# Patient Record
Sex: Female | Born: 1960 | Race: White | Hispanic: No | Marital: Married
Health system: Southern US, Community
[De-identification: ages and names within clinical notes are randomized; demographics above are authoritative.]

---

## 2004-08-24 ENCOUNTER — Ambulatory Visit: Payer: Self-pay | Admitting: General Practice

## 2004-12-30 ENCOUNTER — Ambulatory Visit: Payer: Self-pay | Admitting: Specialist

## 2005-01-25 ENCOUNTER — Inpatient Hospital Stay: Payer: Self-pay | Admitting: Internal Medicine

## 2005-08-23 ENCOUNTER — Ambulatory Visit: Payer: Self-pay | Admitting: General Practice

## 2005-08-26 ENCOUNTER — Ambulatory Visit: Payer: Self-pay | Admitting: Obstetrics and Gynecology

## 2006-08-29 ENCOUNTER — Ambulatory Visit: Payer: Self-pay | Admitting: General Practice

## 2006-08-31 ENCOUNTER — Ambulatory Visit: Payer: Self-pay | Admitting: General Practice

## 2007-03-01 ENCOUNTER — Ambulatory Visit: Payer: Self-pay | Admitting: Internal Medicine

## 2007-04-26 ENCOUNTER — Ambulatory Visit: Payer: Self-pay | Admitting: Obstetrics and Gynecology

## 2007-05-21 ENCOUNTER — Ambulatory Visit: Payer: Self-pay | Admitting: Obstetrics and Gynecology

## 2008-09-17 ENCOUNTER — Ambulatory Visit: Payer: Self-pay | Admitting: General Practice

## 2009-08-01 HISTORY — PX: BREAST BIOPSY: SHX20

## 2009-08-18 ENCOUNTER — Ambulatory Visit: Payer: Self-pay | Admitting: Internal Medicine

## 2009-09-01 ENCOUNTER — Ambulatory Visit: Payer: Self-pay | Admitting: Surgery

## 2009-09-22 ENCOUNTER — Ambulatory Visit: Payer: Self-pay | Admitting: Internal Medicine

## 2010-09-28 ENCOUNTER — Ambulatory Visit: Payer: Self-pay | Admitting: Internal Medicine

## 2011-08-06 IMAGING — US US BREAST BX W LOC DEV 1ST LESION IMG BX SPEC US GUIDE
1 series · 15 of 15 positions shown · non-contrast
Comparison: None

REASON FOR EXAM: left breast mass
COMMENTS:

PROCEDURE:     US  - US GUIDED BIOPSY BREAST LEFT  - September 01, 2009  [DATE]
RESULT:     Indication: Left breast cyst

[Series 1: us breast bx w loc dev 1st lesion img bx spec us g · 15 of 15 slices shown]
[im 1/15]
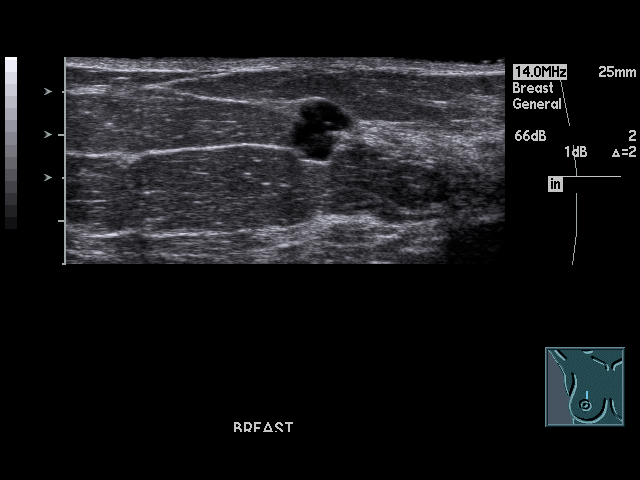
[im 2/15]
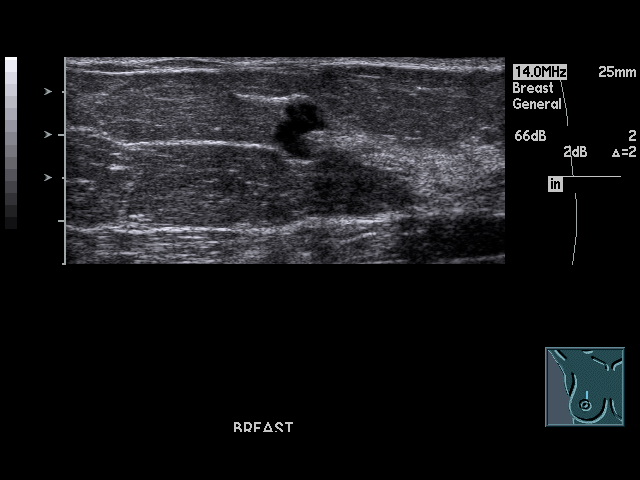
[im 3/15]
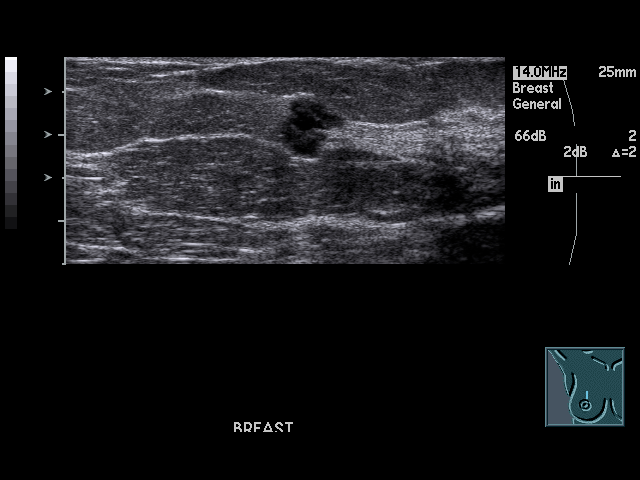
[im 4/15]
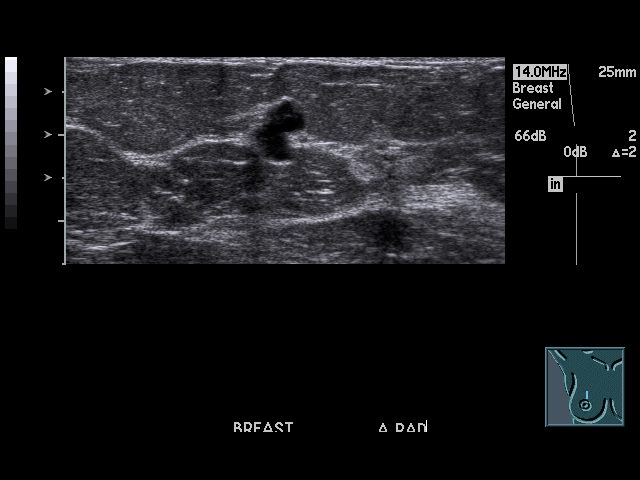
[im 5/15]
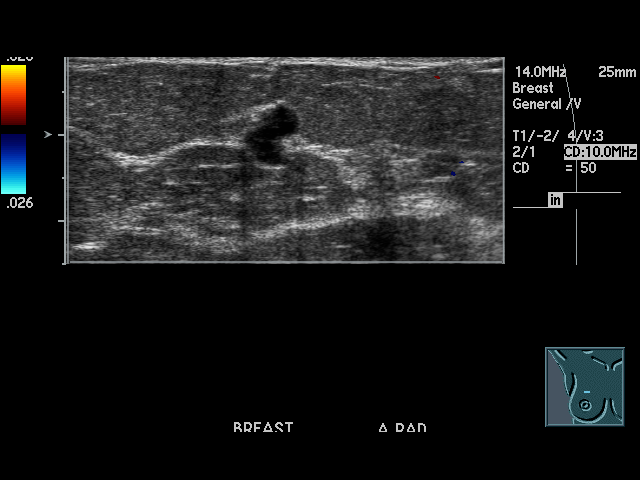
[im 6/15]
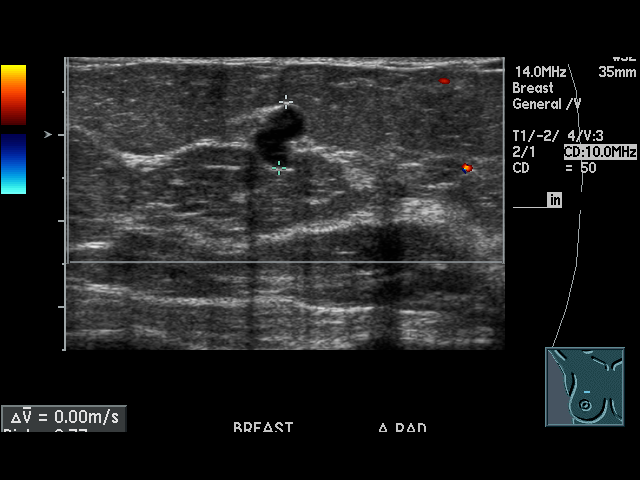
[im 7/15]
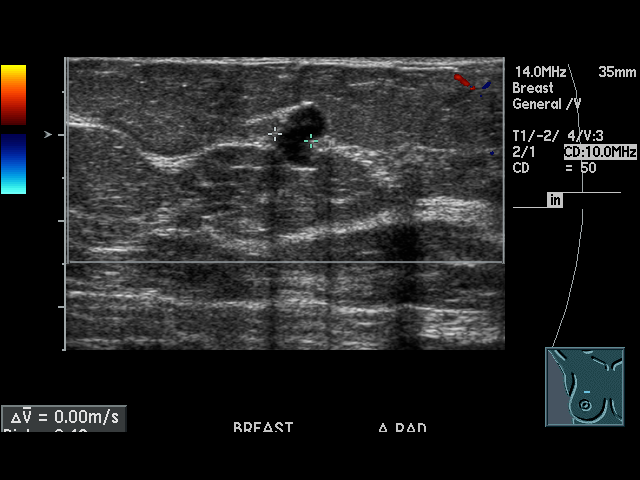
[im 8/15]
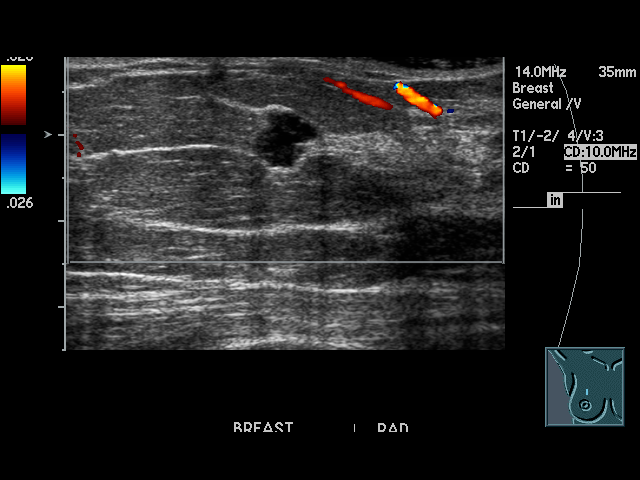
[im 9/15]
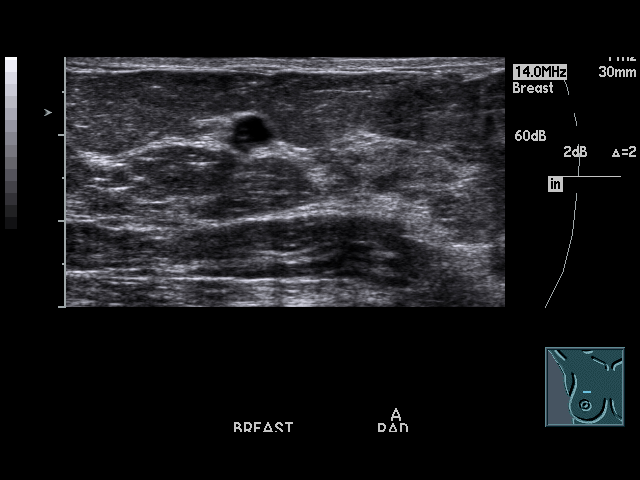
[im 10/15]
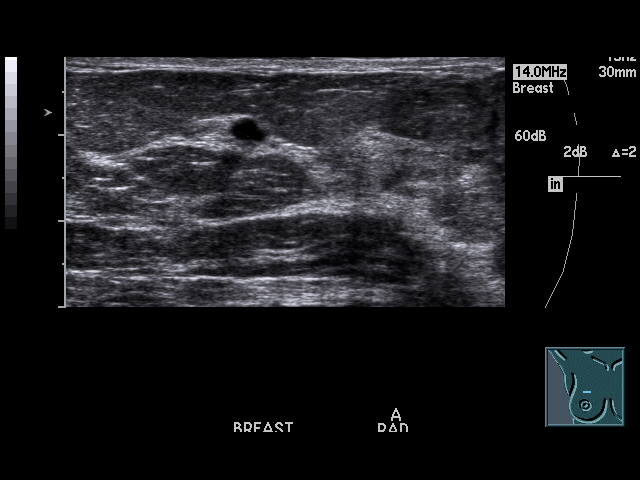
[im 11/15]
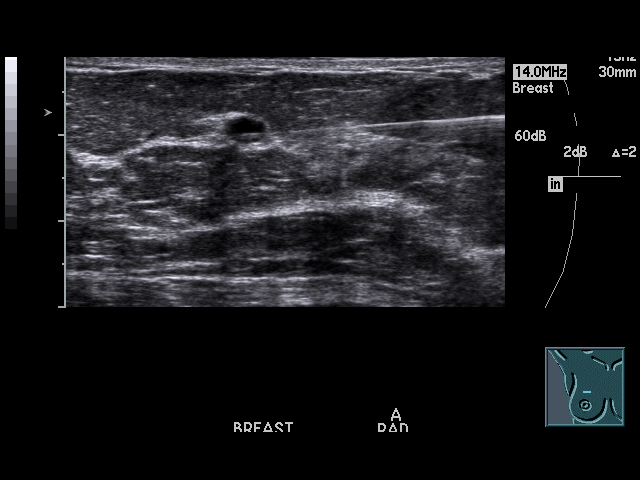
[im 12/15]
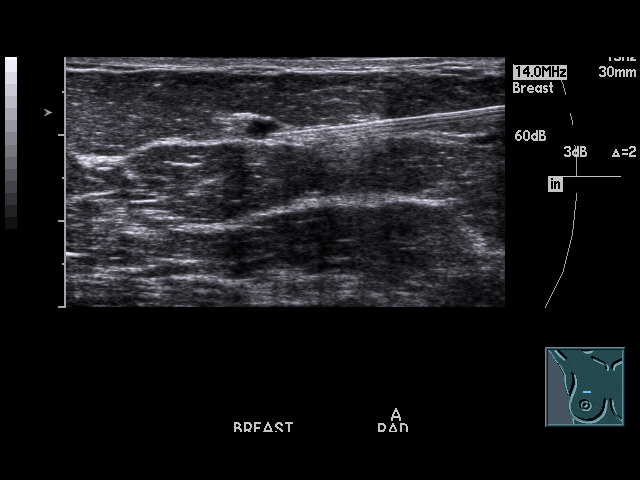
[im 13/15]
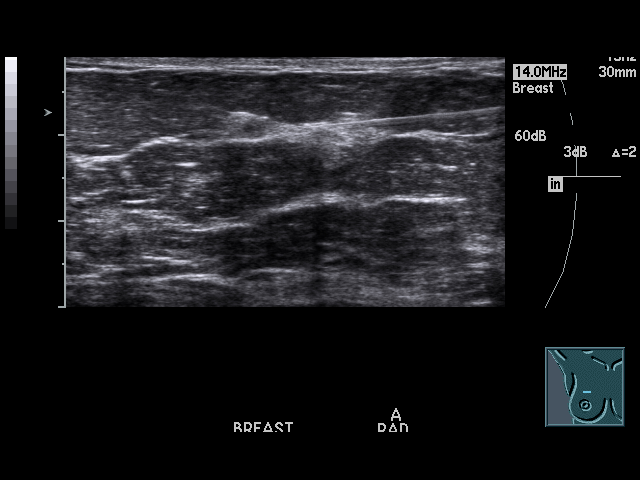
[im 14/15]
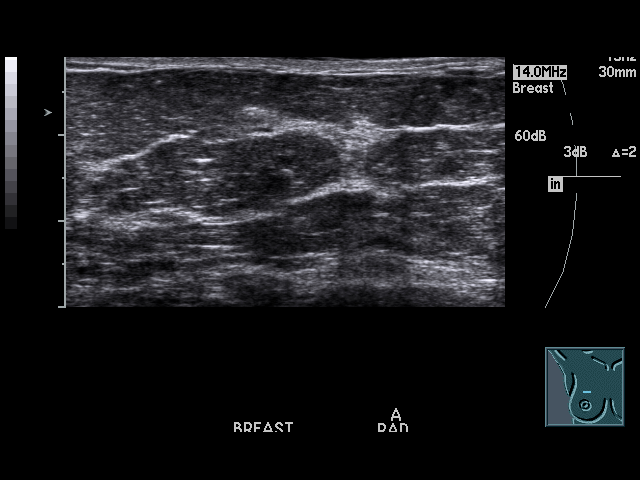
[im 15/15]
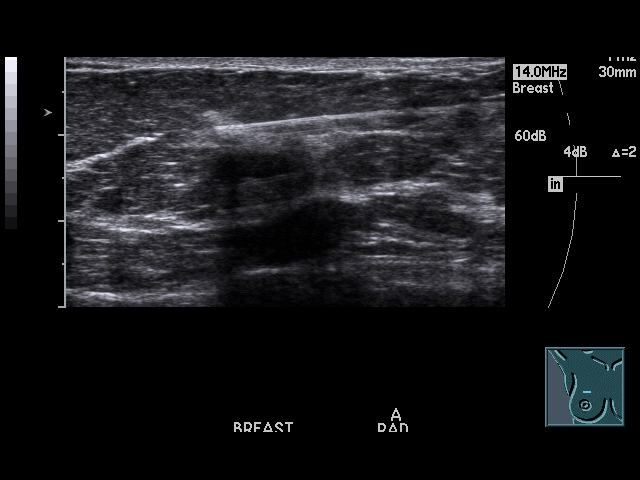

[15 of 15 positions shown; findings below may reference images not displayed]

Consent: After the procedure and its risks including infection, bleeding and
allergy were discussed with the patient and all questions answered, informed
consent was signed.

Procedure: The patient was placed supine on the ultrasound with the left arm
in ABER position. A "timeout" was performed to verify the correct patient
and correct procedure. Initial ultrasound of the left breast demonstrated a
macrolobulated anechoic left breast mass at approximately the 12-1:00
position. The patient was then prepped and draped in the usual sterile
fashion. Realtime ultrasound was used to localize the mass and a biopsy path
was selected. The skin was marked and 1% lidocaine was infiltrated into skin
and soft tissues along the biopsy tract up to the mass for local anesthesia.

A small skin incision with a #11 blade was made at the skin entry site. A
22-gauge needle was advanced into the cystic mass and aspirated. The fluid
lows yellow-greenish in color and was placed into thin prep. Subsequently, a
Biopsy clip was placed at the site of aspiration. The needle was removed,
hemostasis achieved and a band-aid applied to the skin incision. The patient
tolerated the procedure well without immediate complication and was
discharged in good condition after 15 min observation.
IMPRESSION: Successful left breast cyst aspiration.

## 2011-10-05 ENCOUNTER — Ambulatory Visit: Payer: Self-pay | Admitting: Internal Medicine

## 2011-10-07 ENCOUNTER — Ambulatory Visit: Payer: Self-pay | Admitting: Internal Medicine

## 2012-05-31 ENCOUNTER — Ambulatory Visit: Payer: Self-pay | Admitting: Orthopedic Surgery

## 2012-06-07 ENCOUNTER — Ambulatory Visit: Payer: Self-pay | Admitting: Orthopedic Surgery

## 2012-10-10 ENCOUNTER — Ambulatory Visit: Payer: Self-pay | Admitting: Internal Medicine

## 2013-10-17 ENCOUNTER — Ambulatory Visit: Payer: Self-pay | Admitting: General Practice

## 2014-05-12 DIAGNOSIS — N3010 Interstitial cystitis (chronic) without hematuria: Secondary | ICD-10-CM | POA: Insufficient documentation

## 2014-05-12 DIAGNOSIS — E039 Hypothyroidism, unspecified: Secondary | ICD-10-CM | POA: Insufficient documentation

## 2014-10-21 ENCOUNTER — Ambulatory Visit: Payer: Self-pay | Admitting: Internal Medicine

## 2014-11-18 NOTE — Op Note (Signed)
PATIENT NAME:  Mary Kerr, [name] MR#:  [id number] DATE OF BIRTH:  19-Oct-1960  DATE OF PROCEDURE:  06/07/2012  PREOPERATIVE DIAGNOSIS: Right carpal tunnel syndrome.   POSTOPERATIVE DIAGNOSIS: Right carpal tunnel syndrome.   PROCEDURE: Right carpal tunnel release.   SURGEON: Leitha SchullerMichael J. Aradhana Gin, M.D.   ANESTHESIA: General.   DESCRIPTION OF PROCEDURE: The patient was brought to the operating room and after adequate anesthesia was obtained, the arm was prepped and draped in the usual sterile fashion with a tourniquet applied to the upper arm. After appropriate patient identification and time-out procedures were completed, the tourniquet was raised to 250 mmHg. The incision was made in line with the ring metacarpal along a palmar crease, approximately 2 cm in length. The incision was carried down through the skin and subcutaneous tissue with hemostasis being achieved with electrocautery. After spreading the soft tissues, the transverse carpal ligament was identified and released with a small incision initially. A Freer elevator was placed deep to protect the underlying structures and release was then carried out first distally then proximally. After release, there appeared to be compression in the midportion with vascular blush occurring after the more proximal release. This appeared to indicate adequate release. There was moderate tenosynovitis present within the carpal tunnel and a portion debrided.  The wound was then irrigated. The wound was then closed with simple interrupted 5-0 nylon. 10 mL of 0.5% Sensorcaine without epinephrine was infiltrated adjacent to the incision to minimize postoperative pain. There were no complications.  No specimen. The patient was sent to the recovery room in stable condition after application of a sterile dressing of Xeroform, 4 x 4's, Webril, and Ace wrap.   ____________________________ Leitha SchullerMichael J. Calloway Andrus, MD [email or web address] D: 06/07/2012 22:10:09 ET T: 06/08/2012 11:04:28  ET JOB#: [id number] Nolon BussingMICHAEL J Xiana Carns MD ELECTRONICALLY SIGNED 06/08/2012 12:32

## 2015-09-30 ENCOUNTER — Other Ambulatory Visit: Payer: Self-pay | Admitting: Internal Medicine

## 2015-09-30 DIAGNOSIS — Z1231 Encounter for screening mammogram for malignant neoplasm of breast: Secondary | ICD-10-CM

## 2015-10-28 ENCOUNTER — Ambulatory Visit
Admission: RE | Admit: 2015-10-28 | Discharge: 2015-10-28 | Disposition: A | Discharge status: Home / Self Care | Payer: BLUE CROSS/BLUE SHIELD | Source: Ambulatory Visit | Attending: Internal Medicine | Admitting: Internal Medicine

## 2015-10-28 DIAGNOSIS — Z1231 Encounter for screening mammogram for malignant neoplasm of breast: Secondary | ICD-10-CM

## 2016-05-16 DIAGNOSIS — M7711 Lateral epicondylitis, right elbow: Secondary | ICD-10-CM | POA: Insufficient documentation

## 2016-09-28 ENCOUNTER — Other Ambulatory Visit: Payer: Self-pay | Admitting: Internal Medicine

## 2016-09-28 DIAGNOSIS — Z1231 Encounter for screening mammogram for malignant neoplasm of breast: Secondary | ICD-10-CM

## 2016-11-01 ENCOUNTER — Ambulatory Visit
Admission: RE | Admit: 2016-11-01 | Discharge: 2016-11-01 | Disposition: A | Discharge status: Home / Self Care | Payer: BLUE CROSS/BLUE SHIELD | Source: Ambulatory Visit | Attending: Internal Medicine | Admitting: Internal Medicine

## 2016-11-01 DIAGNOSIS — Z1231 Encounter for screening mammogram for malignant neoplasm of breast: Secondary | ICD-10-CM | POA: Insufficient documentation

## 2017-10-16 ENCOUNTER — Other Ambulatory Visit: Payer: Self-pay | Admitting: Internal Medicine

## 2017-10-16 DIAGNOSIS — Z1231 Encounter for screening mammogram for malignant neoplasm of breast: Secondary | ICD-10-CM

## 2017-11-08 ENCOUNTER — Ambulatory Visit
Admission: RE | Admit: 2017-11-08 | Discharge: 2017-11-08 | Disposition: A | Discharge status: Home / Self Care | Payer: BLUE CROSS/BLUE SHIELD | Source: Ambulatory Visit | Attending: Internal Medicine | Admitting: Internal Medicine

## 2017-11-08 DIAGNOSIS — Z1231 Encounter for screening mammogram for malignant neoplasm of breast: Secondary | ICD-10-CM | POA: Diagnosis present

## 2018-05-29 DIAGNOSIS — Z803 Family history of malignant neoplasm of breast: Secondary | ICD-10-CM | POA: Insufficient documentation

## 2018-10-08 ENCOUNTER — Other Ambulatory Visit: Payer: Self-pay | Admitting: Internal Medicine

## 2018-10-08 DIAGNOSIS — Z1231 Encounter for screening mammogram for malignant neoplasm of breast: Secondary | ICD-10-CM

## 2019-01-23 ENCOUNTER — Ambulatory Visit
Admission: RE | Admit: 2019-01-23 | Discharge: 2019-01-23 | Disposition: A | Discharge status: Home / Self Care | Payer: BC Managed Care – PPO | Source: Ambulatory Visit | Attending: Internal Medicine | Admitting: Internal Medicine

## 2019-01-23 ENCOUNTER — Other Ambulatory Visit: Payer: Self-pay

## 2019-01-23 DIAGNOSIS — Z1231 Encounter for screening mammogram for malignant neoplasm of breast: Secondary | ICD-10-CM | POA: Insufficient documentation

## 2019-04-26 ENCOUNTER — Other Ambulatory Visit: Payer: Self-pay

## 2019-04-26 ENCOUNTER — Ambulatory Visit: Payer: Self-pay

## 2019-04-26 DIAGNOSIS — Z9229 Personal history of other drug therapy: Secondary | ICD-10-CM

## 2019-05-23 DIAGNOSIS — R739 Hyperglycemia, unspecified: Secondary | ICD-10-CM | POA: Diagnosis not present

## 2019-05-23 DIAGNOSIS — E039 Hypothyroidism, unspecified: Secondary | ICD-10-CM | POA: Diagnosis not present

## 2019-05-30 DIAGNOSIS — Z0000 Encounter for general adult medical examination without abnormal findings: Secondary | ICD-10-CM | POA: Diagnosis not present

## 2019-05-30 DIAGNOSIS — N3010 Interstitial cystitis (chronic) without hematuria: Secondary | ICD-10-CM | POA: Diagnosis not present

## 2019-11-29 DIAGNOSIS — E039 Hypothyroidism, unspecified: Secondary | ICD-10-CM | POA: Diagnosis not present

## 2019-11-29 DIAGNOSIS — Z0000 Encounter for general adult medical examination without abnormal findings: Secondary | ICD-10-CM | POA: Diagnosis not present

## 2019-11-29 DIAGNOSIS — N200 Calculus of kidney: Secondary | ICD-10-CM | POA: Diagnosis not present

## 2020-01-13 ENCOUNTER — Other Ambulatory Visit: Payer: Self-pay | Admitting: Internal Medicine

## 2020-01-13 DIAGNOSIS — Z1231 Encounter for screening mammogram for malignant neoplasm of breast: Secondary | ICD-10-CM

## 2020-01-28 ENCOUNTER — Ambulatory Visit
Admission: RE | Admit: 2020-01-28 | Discharge: 2020-01-28 | Disposition: A | Discharge status: Home / Self Care | Payer: 59 | Source: Ambulatory Visit | Attending: Internal Medicine | Admitting: Internal Medicine

## 2020-01-28 DIAGNOSIS — Z1231 Encounter for screening mammogram for malignant neoplasm of breast: Secondary | ICD-10-CM | POA: Diagnosis not present

## 2020-05-29 DIAGNOSIS — Z0000 Encounter for general adult medical examination without abnormal findings: Secondary | ICD-10-CM | POA: Diagnosis not present

## 2020-05-29 DIAGNOSIS — N200 Calculus of kidney: Secondary | ICD-10-CM | POA: Diagnosis not present

## 2020-05-29 DIAGNOSIS — E039 Hypothyroidism, unspecified: Secondary | ICD-10-CM | POA: Diagnosis not present

## 2020-06-05 ENCOUNTER — Ambulatory Visit: Payer: 59

## 2020-06-05 DIAGNOSIS — Z23 Encounter for immunization: Secondary | ICD-10-CM

## 2020-06-12 DIAGNOSIS — E876 Hypokalemia: Secondary | ICD-10-CM | POA: Diagnosis not present

## 2020-07-21 DIAGNOSIS — Z0000 Encounter for general adult medical examination without abnormal findings: Secondary | ICD-10-CM | POA: Diagnosis not present

## 2020-07-21 DIAGNOSIS — N3010 Interstitial cystitis (chronic) without hematuria: Secondary | ICD-10-CM | POA: Diagnosis not present

## 2020-12-31 ENCOUNTER — Other Ambulatory Visit: Payer: Self-pay | Admitting: Internal Medicine

## 2020-12-31 DIAGNOSIS — Z1231 Encounter for screening mammogram for malignant neoplasm of breast: Secondary | ICD-10-CM

## 2021-01-12 DIAGNOSIS — Z0000 Encounter for general adult medical examination without abnormal findings: Secondary | ICD-10-CM | POA: Diagnosis not present

## 2021-01-19 DIAGNOSIS — Z0000 Encounter for general adult medical examination without abnormal findings: Secondary | ICD-10-CM | POA: Diagnosis not present

## 2021-01-19 DIAGNOSIS — E039 Hypothyroidism, unspecified: Secondary | ICD-10-CM | POA: Diagnosis not present

## 2021-01-19 DIAGNOSIS — N3010 Interstitial cystitis (chronic) without hematuria: Secondary | ICD-10-CM | POA: Diagnosis not present

## 2021-01-19 DIAGNOSIS — F1190 Opioid use, unspecified, uncomplicated: Secondary | ICD-10-CM | POA: Diagnosis not present

## 2021-02-09 ENCOUNTER — Ambulatory Visit
Admission: RE | Admit: 2021-02-09 | Discharge: 2021-02-09 | Disposition: A | Discharge status: Home / Self Care | Payer: 59 | Source: Ambulatory Visit | Attending: Internal Medicine | Admitting: Internal Medicine

## 2021-02-09 ENCOUNTER — Other Ambulatory Visit: Payer: Self-pay

## 2021-02-09 DIAGNOSIS — Z1231 Encounter for screening mammogram for malignant neoplasm of breast: Secondary | ICD-10-CM | POA: Insufficient documentation

## 2021-06-04 ENCOUNTER — Ambulatory Visit: Payer: Self-pay

## 2021-06-04 DIAGNOSIS — Z23 Encounter for immunization: Secondary | ICD-10-CM

## 2021-07-15 DIAGNOSIS — F1190 Opioid use, unspecified, uncomplicated: Secondary | ICD-10-CM | POA: Diagnosis not present

## 2021-07-15 DIAGNOSIS — Z0000 Encounter for general adult medical examination without abnormal findings: Secondary | ICD-10-CM | POA: Diagnosis not present

## 2021-07-29 DIAGNOSIS — E559 Vitamin D deficiency, unspecified: Secondary | ICD-10-CM | POA: Diagnosis not present

## 2021-07-29 DIAGNOSIS — Z0000 Encounter for general adult medical examination without abnormal findings: Secondary | ICD-10-CM | POA: Diagnosis not present

## 2021-07-29 DIAGNOSIS — N3010 Interstitial cystitis (chronic) without hematuria: Secondary | ICD-10-CM | POA: Diagnosis not present

## 2021-07-29 DIAGNOSIS — E039 Hypothyroidism, unspecified: Secondary | ICD-10-CM | POA: Diagnosis not present

## 2021-12-23 DIAGNOSIS — E042 Nontoxic multinodular goiter: Secondary | ICD-10-CM | POA: Insufficient documentation

## 2021-12-23 DIAGNOSIS — K8020 Calculus of gallbladder without cholecystitis without obstruction: Secondary | ICD-10-CM | POA: Insufficient documentation

## 2021-12-23 DIAGNOSIS — N200 Calculus of kidney: Secondary | ICD-10-CM | POA: Insufficient documentation

## 2021-12-30 ENCOUNTER — Other Ambulatory Visit: Payer: Self-pay | Admitting: Internal Medicine

## 2021-12-30 DIAGNOSIS — Z1231 Encounter for screening mammogram for malignant neoplasm of breast: Secondary | ICD-10-CM

## 2022-01-01 IMAGING — MG DIGITAL SCREENING BILAT W/ TOMO W/ CAD
8 series · 8 of 24 positions shown · non-contrast
Comparison: Previous exam(s).

CLINICAL DATA: Screening.

EXAM:
DIGITAL SCREENING BILATERAL MAMMOGRAM WITH TOMO AND CAD

[L MLO synth-2D]
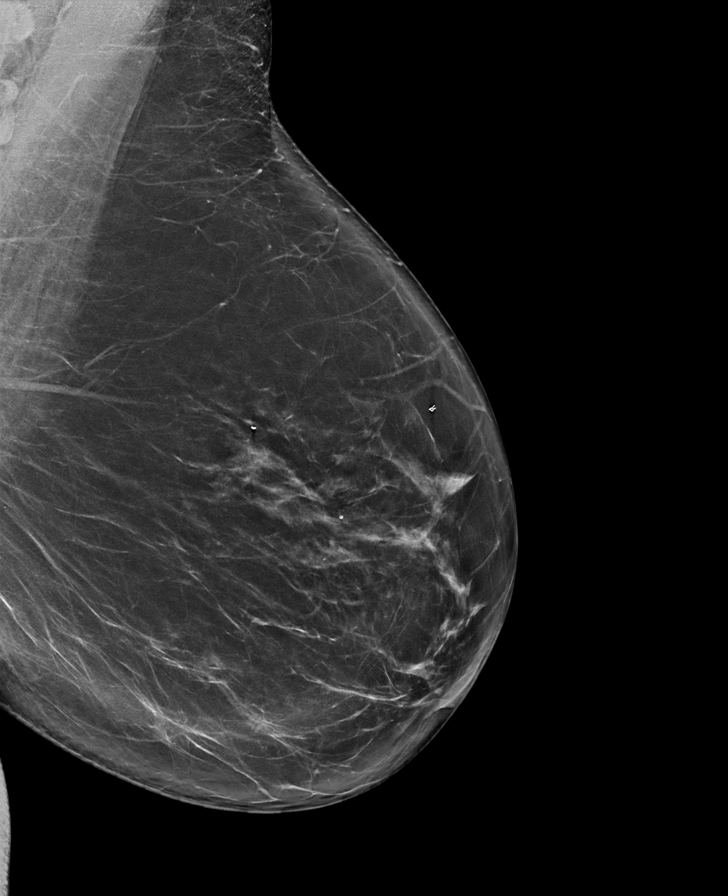

[R MLO synth-2D]
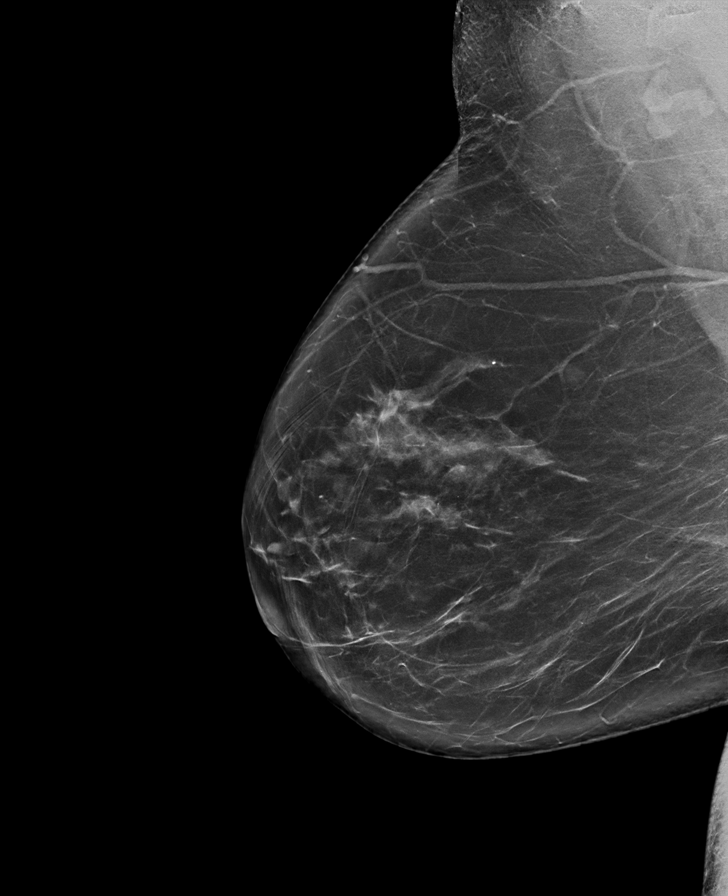

[L CC synth-2D]
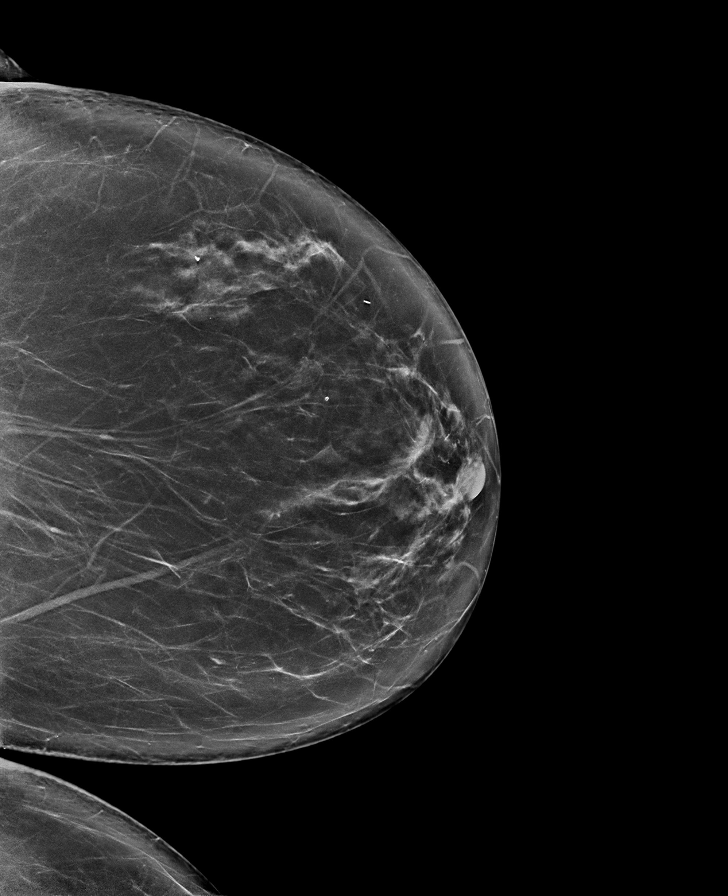

[R CC synth-2D]
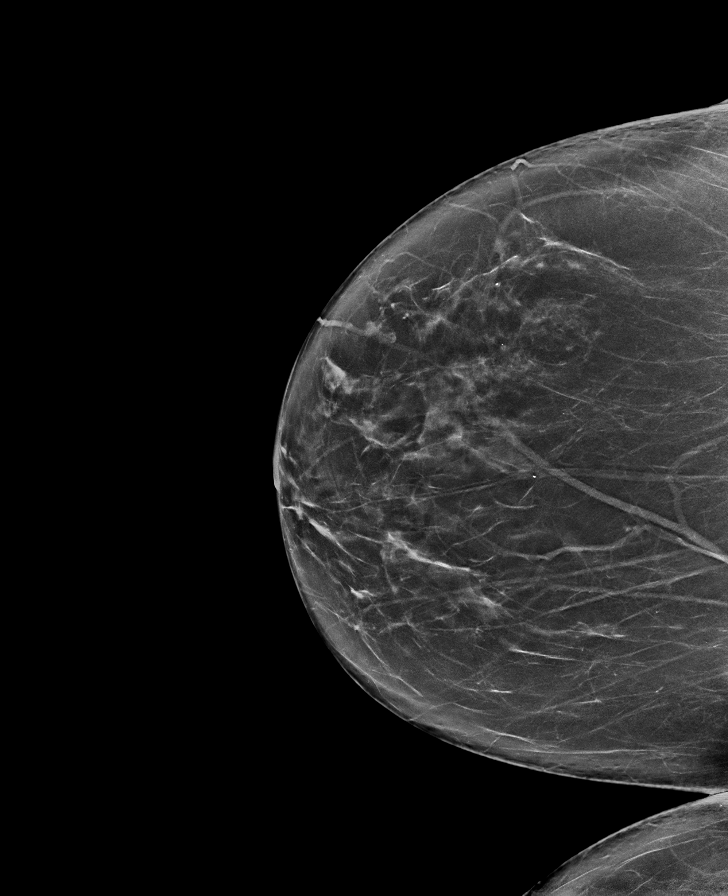

[R MLO tomo · tomo slice 47/93.0]
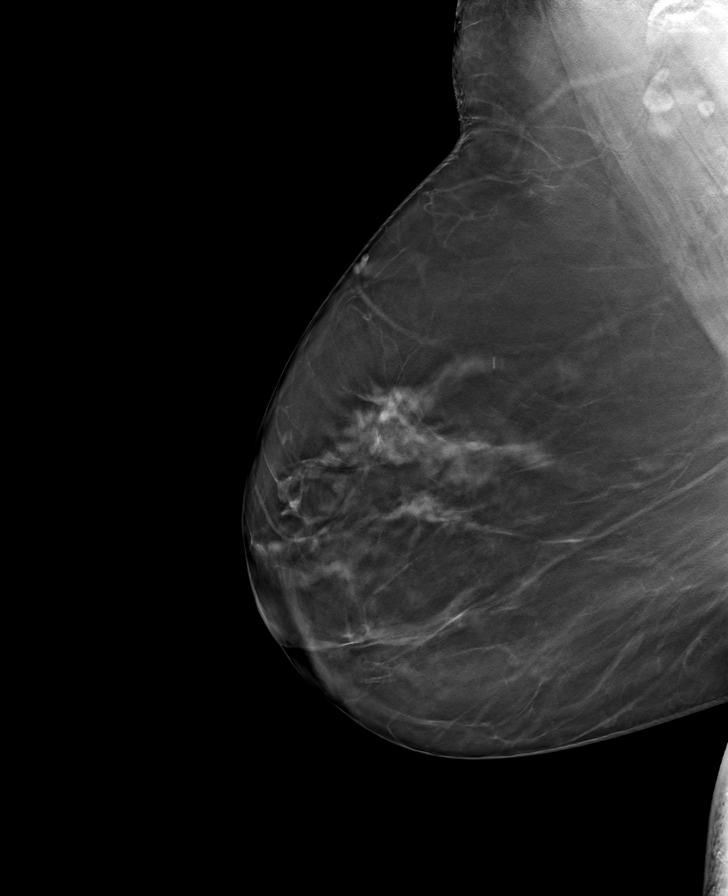

[L CC tomo · tomo slice 43/85.0]
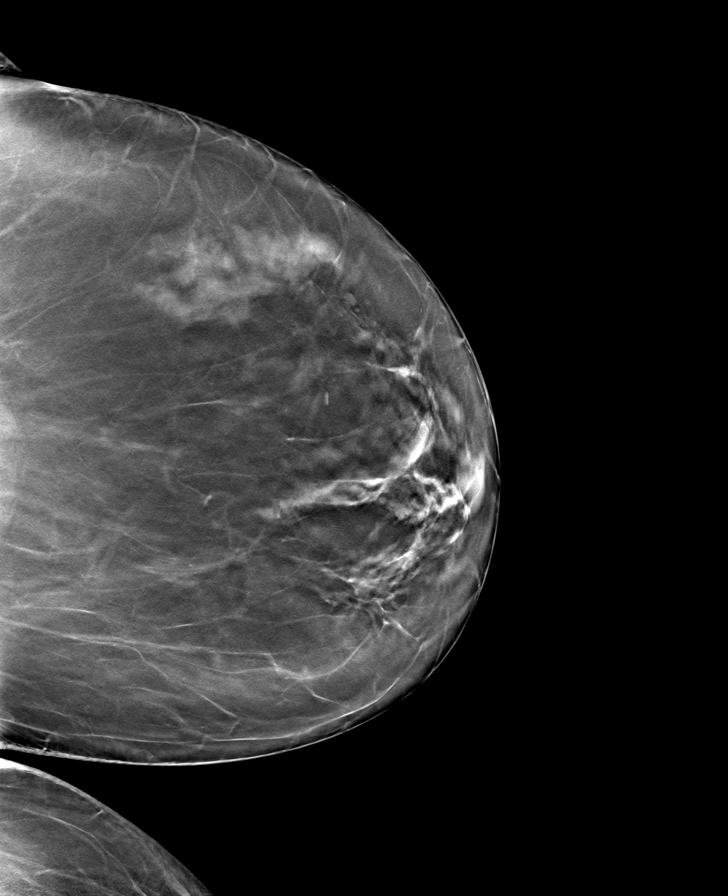

[L MLO tomo · tomo slice 45/88.0]
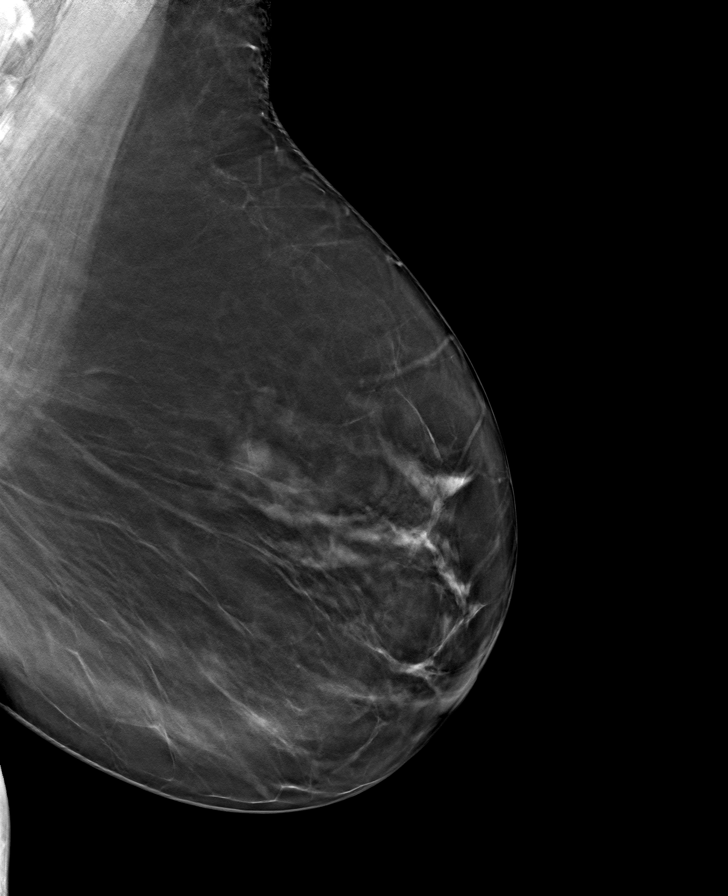

[R CC tomo · tomo slice 41/81.0]
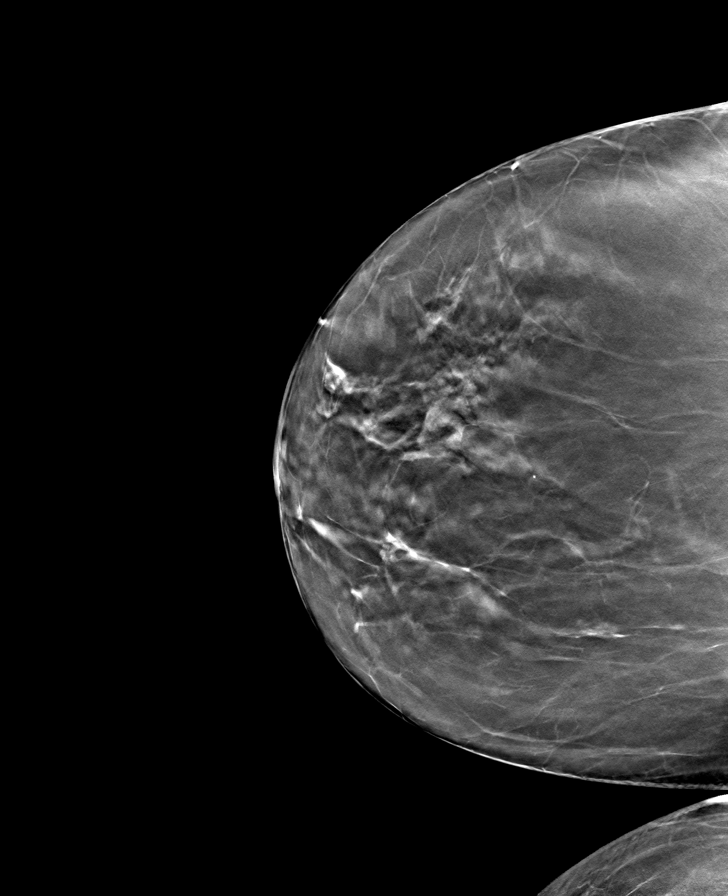

[8 of 24 positions shown; findings below may reference images not displayed]

ACR Breast Density Category b: There are scattered areas of
fibroglandular density.
FINDINGS: There are no findings suspicious for malignancy. Images were
processed with CAD.
IMPRESSION: No mammographic evidence of malignancy. A result letter of this
screening mammogram will be mailed directly to the patient.

RECOMMENDATION:
Screening mammogram in one year. (Code:[id number])

BI-RADS CATEGORY  1: Negative.

## 2022-01-21 DIAGNOSIS — N3010 Interstitial cystitis (chronic) without hematuria: Secondary | ICD-10-CM | POA: Diagnosis not present

## 2022-01-21 DIAGNOSIS — E039 Hypothyroidism, unspecified: Secondary | ICD-10-CM | POA: Diagnosis not present

## 2022-01-26 DIAGNOSIS — F1190 Opioid use, unspecified, uncomplicated: Secondary | ICD-10-CM | POA: Diagnosis not present

## 2022-01-26 DIAGNOSIS — E039 Hypothyroidism, unspecified: Secondary | ICD-10-CM | POA: Diagnosis not present

## 2022-01-26 DIAGNOSIS — M7122 Synovial cyst of popliteal space [Baker], left knee: Secondary | ICD-10-CM | POA: Diagnosis not present

## 2022-01-26 DIAGNOSIS — N3010 Interstitial cystitis (chronic) without hematuria: Secondary | ICD-10-CM | POA: Diagnosis not present

## 2022-02-10 ENCOUNTER — Ambulatory Visit
Admission: RE | Admit: 2022-02-10 | Discharge: 2022-02-10 | Disposition: A | Discharge status: Home / Self Care | Payer: 59 | Source: Ambulatory Visit | Attending: Internal Medicine | Admitting: Internal Medicine

## 2022-02-10 DIAGNOSIS — Z1231 Encounter for screening mammogram for malignant neoplasm of breast: Secondary | ICD-10-CM | POA: Insufficient documentation

## 2022-04-07 DIAGNOSIS — M1712 Unilateral primary osteoarthritis, left knee: Secondary | ICD-10-CM | POA: Diagnosis not present

## 2022-07-27 DIAGNOSIS — H524 Presbyopia: Secondary | ICD-10-CM | POA: Diagnosis not present

## 2022-07-27 DIAGNOSIS — H5203 Hypermetropia, bilateral: Secondary | ICD-10-CM | POA: Diagnosis not present

## 2022-07-28 DIAGNOSIS — Z0000 Encounter for general adult medical examination without abnormal findings: Secondary | ICD-10-CM | POA: Diagnosis not present

## 2022-07-28 DIAGNOSIS — H5203 Hypermetropia, bilateral: Secondary | ICD-10-CM | POA: Diagnosis not present

## 2022-08-04 DIAGNOSIS — N3010 Interstitial cystitis (chronic) without hematuria: Secondary | ICD-10-CM | POA: Diagnosis not present

## 2022-08-04 DIAGNOSIS — Z0000 Encounter for general adult medical examination without abnormal findings: Secondary | ICD-10-CM | POA: Diagnosis not present

## 2022-10-07 DIAGNOSIS — M19012 Primary osteoarthritis, left shoulder: Secondary | ICD-10-CM | POA: Diagnosis not present

## 2022-10-07 DIAGNOSIS — M75102 Unspecified rotator cuff tear or rupture of left shoulder, not specified as traumatic: Secondary | ICD-10-CM | POA: Diagnosis not present

## 2022-10-07 DIAGNOSIS — M47812 Spondylosis without myelopathy or radiculopathy, cervical region: Secondary | ICD-10-CM | POA: Diagnosis not present

## 2022-10-07 DIAGNOSIS — M5030 Other cervical disc degeneration, unspecified cervical region: Secondary | ICD-10-CM | POA: Diagnosis not present

## 2023-03-28 ENCOUNTER — Other Ambulatory Visit: Payer: Self-pay | Admitting: Internal Medicine

## 2023-03-28 DIAGNOSIS — Z1231 Encounter for screening mammogram for malignant neoplasm of breast: Secondary | ICD-10-CM

## 2023-03-31 ENCOUNTER — Ambulatory Visit
Admission: RE | Admit: 2023-03-31 | Discharge: 2023-03-31 | Disposition: A | Discharge status: Home / Self Care | Payer: 59 | Source: Ambulatory Visit | Attending: Internal Medicine | Admitting: Internal Medicine

## 2023-03-31 DIAGNOSIS — Z1231 Encounter for screening mammogram for malignant neoplasm of breast: Secondary | ICD-10-CM

## 2023-04-06 ENCOUNTER — Other Ambulatory Visit: Payer: Self-pay | Admitting: Internal Medicine

## 2023-04-06 DIAGNOSIS — R928 Other abnormal and inconclusive findings on diagnostic imaging of breast: Secondary | ICD-10-CM

## 2023-04-12 ENCOUNTER — Ambulatory Visit
Admission: RE | Admit: 2023-04-12 | Discharge: 2023-04-12 | Disposition: A | Discharge status: Home / Self Care | Payer: 59 | Source: Ambulatory Visit | Attending: Internal Medicine | Admitting: Internal Medicine

## 2023-04-12 DIAGNOSIS — R928 Other abnormal and inconclusive findings on diagnostic imaging of breast: Secondary | ICD-10-CM | POA: Diagnosis present

## 2023-04-14 ENCOUNTER — Other Ambulatory Visit: Payer: Self-pay | Admitting: Family Medicine

## 2023-04-14 DIAGNOSIS — R928 Other abnormal and inconclusive findings on diagnostic imaging of breast: Secondary | ICD-10-CM

## 2023-04-14 DIAGNOSIS — R921 Mammographic calcification found on diagnostic imaging of breast: Secondary | ICD-10-CM

## 2023-04-18 ENCOUNTER — Ambulatory Visit
Admission: RE | Admit: 2023-04-18 | Discharge: 2023-04-18 | Disposition: A | Discharge status: Home / Self Care | Payer: 59 | Source: Ambulatory Visit | Attending: Family Medicine | Admitting: Family Medicine

## 2023-04-18 DIAGNOSIS — R928 Other abnormal and inconclusive findings on diagnostic imaging of breast: Secondary | ICD-10-CM | POA: Diagnosis present

## 2023-04-18 DIAGNOSIS — R921 Mammographic calcification found on diagnostic imaging of breast: Secondary | ICD-10-CM | POA: Insufficient documentation

## 2023-04-18 HISTORY — PX: BREAST BIOPSY: SHX20

## 2023-04-18 MED ORDER — LIDOCAINE-EPINEPHRINE 1 %-1:100000 IJ SOLN
20.0000 mL | Freq: Once | INTRAMUSCULAR | Status: AC
  Administered 2023-04-18: 20 mL
  Filled 2023-04-18: qty 20

## 2023-04-18 MED ORDER — LIDOCAINE 1 % OPTIME INJ - NO CHARGE
5.0000 mL | Freq: Once | INTRAMUSCULAR | Status: AC
  Administered 2023-04-18: 5 mL
  Filled 2023-04-18: qty 6

## 2023-08-09 DIAGNOSIS — M519 Unspecified thoracic, thoracolumbar and lumbosacral intervertebral disc disorder: Secondary | ICD-10-CM | POA: Insufficient documentation

## 2024-03-08 ENCOUNTER — Other Ambulatory Visit: Payer: Self-pay | Admitting: Internal Medicine

## 2024-03-08 DIAGNOSIS — Z1231 Encounter for screening mammogram for malignant neoplasm of breast: Secondary | ICD-10-CM

## 2024-04-02 ENCOUNTER — Encounter

## 2024-05-01 ENCOUNTER — Ambulatory Visit
Admission: RE | Admit: 2024-05-01 | Discharge: 2024-05-01 | Disposition: A | Discharge status: Home / Self Care | Source: Ambulatory Visit | Attending: Internal Medicine | Admitting: Internal Medicine

## 2024-05-01 DIAGNOSIS — Z1231 Encounter for screening mammogram for malignant neoplasm of breast: Secondary | ICD-10-CM | POA: Diagnosis present

## 2024-07-30 ENCOUNTER — Other Ambulatory Visit: Payer: Self-pay

## 2024-07-30 NOTE — Progress Notes (Unsigned)
Reconciled problem list
